# Patient Record
Sex: Male | Born: 1970 | Race: White | Hispanic: No | Marital: Single | Smoking: Never smoker
Health system: Southern US, Community
[De-identification: ages and names within clinical notes are randomized; demographics above are authoritative.]

## PROBLEM LIST (undated history)

## (undated) HISTORY — PX: WISDOM TOOTH EXTRACTION: SHX21

---

## 2003-08-03 ENCOUNTER — Emergency Department (HOSPITAL_COMMUNITY): Admission: EM | Admit: 2003-08-03 | Discharge: 2003-08-03 | Payer: Self-pay | Admitting: *Deleted

## 2013-12-30 ENCOUNTER — Encounter (HOSPITAL_COMMUNITY): Payer: Self-pay | Admitting: Emergency Medicine

## 2013-12-30 ENCOUNTER — Emergency Department (HOSPITAL_COMMUNITY): Payer: 59

## 2013-12-30 ENCOUNTER — Emergency Department (HOSPITAL_COMMUNITY)
Admission: EM | Admit: 2013-12-30 | Discharge: 2013-12-30 | Disposition: A | Discharge status: Home / Self Care | Payer: 59 | Attending: Emergency Medicine | Admitting: Emergency Medicine

## 2013-12-30 DIAGNOSIS — Y9364 Activity, baseball: Secondary | ICD-10-CM | POA: Insufficient documentation

## 2013-12-30 DIAGNOSIS — Y92838 Other recreation area as the place of occurrence of the external cause: Secondary | ICD-10-CM

## 2013-12-30 DIAGNOSIS — W219XXA Striking against or struck by unspecified sports equipment, initial encounter: Secondary | ICD-10-CM | POA: Insufficient documentation

## 2013-12-30 DIAGNOSIS — S0990XA Unspecified injury of head, initial encounter: Secondary | ICD-10-CM

## 2013-12-30 DIAGNOSIS — Z79899 Other long term (current) drug therapy: Secondary | ICD-10-CM | POA: Insufficient documentation

## 2013-12-30 DIAGNOSIS — Y9239 Other specified sports and athletic area as the place of occurrence of the external cause: Secondary | ICD-10-CM | POA: Insufficient documentation

## 2013-12-30 MED ORDER — IBUPROFEN 800 MG PO TABS
800.0000 mg | ORAL_TABLET | Freq: Once | ORAL | Status: AC
  Administered 2013-12-30: 800 mg via ORAL
  Filled 2013-12-30: qty 1

## 2013-12-30 MED ORDER — ONDANSETRON HCL 4 MG PO TABS: 4.0000 mg | ORAL_TABLET | Freq: Four times a day (QID) | ORAL | Status: DC

## 2013-12-30 NOTE — ED Notes (Signed)
Per EMS: pt was outside of the batting cages and was hit in the head with a baseball that was hit from the cages. Pt states that he does remember everything prior to being hit. Pt has an marking from the baseball behind his left ear. Bystanders states that he did pass out.

## 2013-12-30 NOTE — ED Provider Notes (Signed)
CSN: [id number]     Arrival date & time 12/30/13  1932 History   First MD Initiated Contact with Patient 12/30/13 1947     Chief Complaint  Patient presents with  . Head Injury     (Consider location/radiation/quality/duration/timing/severity/associated sxs/prior Treatment) Patient is a 43 y.o. male presenting with head injury. The history is provided by the patient and a relative.  Head Injury Location:  L parietal Mechanism of injury: direct blow   Pain details:    Quality:  Aching   Severity:  Severe   Timing:  Constant   Progression:  Unchanged Chronicity:  New Relieved by:  Nothing Worsened by:  Nothing tried Ineffective treatments:  None tried Associated symptoms: headache   Associated symptoms: no blurred vision, no difficulty breathing, no double vision, no focal weakness, no nausea, no neck pain, no numbness and no vomiting     43 yo male sp head injury. Baseball hit off bat. Struck patient to left head behind left ear. ?LOC. Repetitive questioning for couple minutes. Left ear ringing for about 10 minutes. No n/v. No neck pain. No other injuries. No acting himself per patient and family. Headache to area. Otherwise without complaint currently.  Not on blood thinners.  Previously feeling well.    History reviewed. No pertinent past medical history. Past Surgical History  Procedure Laterality Date  . Wisdom tooth extraction     History reviewed. No pertinent family history. History  Substance Use Topics  . Smoking status: Never Smoker   . Smokeless tobacco: Not on file  . Alcohol Use: No    Review of Systems  Constitutional: Negative for fever and chills.  HENT: Negative for congestion and rhinorrhea.   Eyes: Negative for blurred vision, double vision and visual disturbance.  Respiratory: Negative for cough and shortness of breath.   Cardiovascular: Negative for chest pain.  Gastrointestinal: Negative for nausea, vomiting and abdominal pain.  Genitourinary:  Negative for flank pain and difficulty urinating.  Musculoskeletal: Negative for back pain and neck pain.  Skin: Positive for color change (redness to location where patient got struck).  Neurological: Positive for headaches. Negative for dizziness, focal weakness, weakness and numbness.  All other systems reviewed and are negative.     Allergies  Review of patient's allergies indicates no known allergies.  Home Medications   Prior to Admission medications   Medication Sig Start Date End Date Taking? Authorizing Provider  ibuprofen (ADVIL,MOTRIN) 200 MG tablet Take 400 mg by mouth every 6 (six) hours as needed.   Yes Historical Provider, MD  Probiotic Product (PROBIOTIC DAILY PO) Take 1 tablet by mouth daily.   Yes Historical Provider, MD  ondansetron (ZOFRAN) 4 MG tablet Take 1 tablet (4 mg total) by mouth every 6 (six) hours. 12/30/13   Shanna CiscoMegan E Docherty, MD   BP 138/83  Pulse 97  Temp(Src) 98.2 F (36.8 C) (Oral)  Resp 16  SpO2 98% Physical Exam  Nursing note and vitals reviewed. Constitutional: He is oriented to person, place, and time. He appears well-developed and well-nourished. No distress.  HENT:  Head: Normocephalic and atraumatic. Head is without raccoon's eyes, without Battle's sign and without laceration.    Right Ear: Tympanic membrane normal.  Left Ear: Tympanic membrane normal.  Eyes: Conjunctivae and EOM are normal. Pupils are equal, round, and reactive to light. Right eye exhibits no discharge. Left eye exhibits no discharge.  Neck: Normal range of motion. Neck supple.  Cardiovascular: Normal heart sounds and intact distal pulses.  Pulmonary/Chest: Effort normal and breath sounds normal. No stridor. No respiratory distress. He has no wheezes. He has no rales.  Abdominal: Soft. He exhibits no distension. There is no tenderness. There is no guarding.  Musculoskeletal: He exhibits no edema and no tenderness.  Neurological: He is alert and oriented to person,  place, and time. He has normal strength. No cranial nerve deficit or sensory deficit. Coordination normal. GCS eye subscore is 4. GCS verbal subscore is 5. GCS motor subscore is 6.  Skin: Skin is warm and dry.  Psychiatric: He has a normal mood and affect. His behavior is normal.    ED Course  Procedures (including critical care time) Labs Review Labs Reviewed - No data to display  Imaging Review Ct Head Wo Contrast  12/30/2013   CLINICAL DATA:  Head injury.  Head hit with a baseball.  EXAM: CT HEAD WITHOUT CONTRAST  TECHNIQUE: Contiguous axial images were obtained from the base of the skull through the vertex without intravenous contrast.  COMPARISON:  None.  FINDINGS: Ventricles are normal in size and configuration. There are no parenchymal masses or mass effect. There are no areas of abnormal parenchymal attenuation. There are no extra-axial masses or abnormal fluid collections.  No intracranial hemorrhage.  No skull fracture. Visualized sinuses and mastoid air cells are clear.  IMPRESSION: Normal unenhanced CT scan of the brain.   Electronically Signed   By: Amie Portland M.D.   On: 12/30/2013 20:31     EKG Interpretation None      MDM   Final diagnoses:  Head injury    43 yo male sp head injury. Now GCS 15 and neuro intact. No n/v.  CT head negative.  No neck pain.  Patient discharged home. Return precautions given. To follow up with pcp. patient in agreement with plan.  Discussed head injury precautions.   Discussed case with Dr. Micheline Maze who is in agreement with assessment and plan.    Stevie Kern, MD 12/31/13 [phone]

## 2013-12-30 NOTE — ED Notes (Signed)
Pt returned from CT °

## 2013-12-30 NOTE — Discharge Instructions (Signed)
Head Injury, Adult °You have received a head injury. It does not appear serious at this time. Headaches and vomiting are common following head injury. It should be easy to awaken from sleeping. Sometimes it is necessary for you to stay in the emergency department for a while for observation. Sometimes admission to the hospital may be needed. After injuries such as yours, most problems occur within the first 24 hours, but side effects may occur up to 7 10 days after the injury. It is important for you to carefully monitor your condition and contact your health care provider or seek immediate medical care if there is a change in your condition. °WHAT ARE THE TYPES OF HEAD INJURIES? °Head injuries can be as minor as a bump. Some head injuries can be more severe. More severe head injuries include: °· A jarring injury to the brain (concussion). °· A bruise of the brain (contusion). This mean there is bleeding in the brain that can cause swelling. °· A cracked skull (skull fracture). °· Bleeding in the brain that collects, clots, and forms a bump (hematoma). °WHAT CAUSES A HEAD INJURY? °A serious head injury is most likely to happen to someone who is in a car wreck and is not wearing a seat belt. Other causes of major head injuries include bicycle or motorcycle accidents, sports injuries, and falls. °HOW ARE HEAD INJURIES DIAGNOSED? °A complete history of the event leading to the injury and your current symptoms will be helpful in diagnosing head injuries. Many times, pictures of the brain, such as CT or MRI are needed to see the extent of the injury. Often, an overnight hospital stay is necessary for observation.  °WHEN SHOULD I SEEK IMMEDIATE MEDICAL CARE?  °You should get help right away if: °· You have confusion or drowsiness. °· You feel sick to your stomach (nauseous) or have continued, forceful vomiting. °· You have dizziness or unsteadiness that is getting worse. °· You have severe, continued headaches not  relieved by medicine. Only take over-the-counter or prescription medicines for pain, fever, or discomfort as directed by your health care provider. °· You do not have normal function of the arms or legs or are unable to walk. °· You notice changes in the black spots in the center of the colored part of your eye (pupil). °· You have a clear or bloody fluid coming from your nose or ears. °· You have a loss of vision. °During the next 24 hours after the injury, you must stay with someone who can watch you for the warning signs. This person should contact local emergency services (911 in the U.S.) if you have seizures, you become unconscious, or you are unable to wake up. °HOW CAN I PREVENT A HEAD INJURY IN THE FUTURE? °The most important factor for preventing major head injuries is avoiding motor vehicle accidents.  To minimize the potential for damage to your head, it is crucial to wear seat belts while riding in motor vehicles. Wearing helmets while bike riding and playing collision sports (like football) is also helpful. Also, avoiding dangerous activities around the house will further help reduce your risk of head injury.  °WHEN CAN I RETURN TO NORMAL ACTIVITIES AND ATHLETICS? °You should be reevaluated by your health care provider before returning to these activities. If you have any of the following symptoms, you should not return to activities or contact sports until 1 week after the symptoms have stopped: °· Persistent headache. °· Dizziness or vertigo. °· Poor attention and concentration. °·   Confusion.  Memory problems.  Nausea or vomiting.  Fatigue or tire easily.  Irritability.  Intolerant of bright lights or loud noises.  Anxiety or depression.  Disturbed sleep. MAKE SURE YOU:   Understand these instructions.  Will watch your condition.  Will get help right away if you are not doing well or get worse. Document Released: 07/15/2005 Document Revised: 05/05/2013 Document Reviewed:  03/22/2013 [facility] Patient Information 2014 [place], [place].   Emergency Department Resource Guide 1) Find a Doctor and Pay Out of Pocket Although you won't have to find out who is covered by your insurance plan, it is a good idea to ask around and get recommendations. You will then need to call the office and see if the doctor you have chosen will accept you as a new patient and what types of options they offer for patients who are self-pay. Some doctors offer discounts or will set up payment plans for their patients who do not have insurance, but you will need to ask so you aren't surprised when you get to your appointment.  2) Contact Your Local Health Department Not all health departments have doctors that can see patients for sick visits, but many do, so it is worth a call to see if yours does. If you don't know where your local health department is, you can check in your phone book. The CDC also has a tool to help you locate your state's health department, and many state websites also have listings of all of their local health departments.  3) Find a Walk-in Clinic If your illness is not likely to be very severe or complicated, you may want to try a walk in clinic. These are popping up all over the country in pharmacies, drugstores, and shopping centers. They're usually staffed by nurse practitioners or physician assistants that have been trained to treat common illnesses and complaints. They're usually fairly quick and inexpensive. However, if you have serious medical issues or chronic medical problems, these are probably not your best option.  No Primary Care Doctor: - Call Health Connect at  [phone] - they can help you locate a primary care doctor that  accepts your insurance, provides certain services, etc. - Physician Referral Service- [phone]  Chronic Pain Problems: Organization         Address  Phone   Notes  [facility]  [phone] Patients need to  be referred by their primary care doctor.   Medication Assistance: Organization         Address  Phone   Notes  [facility] Medication [facility] [address] [place], [place] [address] [phone] --Must be a resident of [facility] -- Must have NO insurance coverage whatsoever (no Medicaid/ Medicare, etc.) -- The pt. MUST have a primary care doctor that directs their care regularly and follows them in the community   [facility]  [phone]   [employer]  [phone]    Agencies that provide inexpensive medical care: Organization         Address  Phone   Notes  [facility]  [phone]   [facility]    [phone]   [facility] [address] [place], [place] [address] [phone]   [facility] [address] [place], [place] [phone]   Planned Parenthood    [phone]   [facility]    [phone]  [facility]  [address], [place] Phone:  [phone], Fax:  [phone] Hours of Operation:  9 am - 6 pm, M-F.  Also accepts Medicaid/Medicare and self-pay.  [facility]  [address], [place] Phone: [phone], Fax: [phone]. Hours of Operation:  8:30 am - 5:30 pm, M-F.  Also accepts Medicaid and self-pay.  [facility] [place] [address] [place] Phone: [phone]   [facility] [address] Natasha Bence [place], [place] [phone], Ext. [phone] Mondays & Thursdays: 7-9 AM.  First 15 patients are seen on a first come, first serve basis.    Medicaid-accepting [facility] Providers:  Organization         Address  Phone   Notes  [facility] [address], [place] [phone] Also accepts self-pay patients.  [facility] [address] Laurell Josephs [place], [place]  [phone]   [facility] [address], [place] [phone]   [facility] [address], [place] [phone]   Renaye Rakers [address], [place]   [phone] Only accepts [place] patients after they have their name applied to their card.   Self-Pay (no insurance) in [facility]:  Organization         Address  Phone   Notes  Sickle Cell Patients, [facility] [address] [place], [place] [phone]   [facility] [address] [place], [place] [phone]   [facility] [place]  [address], [place] [phone]   [facility]/Dr. Osei-Bonsu  [address], [place] or [address], [place] [phone] Phone number for both [place] and [place] locations is the same.  [facility] [address] [place] [phone]   [facility] [address] [place] or [address] [phone]   [facility] [address], [place] [phone], phone; [phone], fax Sees patients 1st and 3rd Saturday of every month.  Must not qualify for public or private insurance (i.e. Medicaid, Medicare, [place] Health Choice, Veterans' Benefits)  Household income should be no more than 200% of the poverty level The clinic cannot treat you if you are pregnant or think you are pregnant  Sexually transmitted diseases are not treated at the clinic.    Dental Care: Organization         Address  Phone  Notes  [facility] Department of [facility] [address] [place], [place] [phone] Accepts children up to age 71 who are enrolled in [place] or [place] Health Choice; pregnant women with a Medicaid card; and children who have applied for Medicaid or [place] Health Choice, but were declined, whose parents can  pay a reduced fee at time of service.  [facility]  [address], [place] [phone] Accepts children up to age 80 who are enrolled in [place] or [place] Health Choice; pregnant women with a Medicaid card; and children who have applied for Medicaid or [place] Health Choice, but were declined, whose parents can pay a reduced fee at time of service.  [facility]  [phone]  [address] [place], [place] [phone] Patients are seen by appointment only. Walk-ins are not accepted. [facility] will see patients 2 years of age and older. Monday - Tuesday (8am-5pm) Most Wednesdays (8:30-5pm) $30 per visit, cash only  [facility] Adult Dental Access PROGRAM  [address], [place] [facility] [phone] Patients are seen by appointment only. Walk-ins are not accepted. [facility] will see patients 77 years of age and older. One Wednesday Evening (Monthly: Volunteer Based).  $30 per visit, cash only  [employer]  [phone] for adults; Children under age 18, call Graduate Pediatric Dentistry at [phone]. Children aged 70-14, please call [phone] to request a pediatric application.  Dental services are provided in all areas of dental care including fillings, crowns and bridges, complete and partial dentures, implants, gum treatment, root canals, and extractions. Preventive care is also provided. Treatment is provided to both adults and children. Patients are selected via a lottery and there is often a waiting list.   [facility] [address] [place]  [phone] [email or web address]   [facility] [address] [place], [place] [phone], Ext. [phone] Second and Fourth Thursday of each month, opens at 6:30 AM; Clinic ends at 9 AM.  Patients are seen on a first-come first-served basis, and a limited number are seen during each clinic.   [facility]  [address] Ether Griffins [place], [place] [phone]   Eligibility Requirements You must have lived in [place], [place], or [place] counties for at least the last three months.   You cannot be eligible for state or federal sponsored [place], including [employer], [place], or [employer].   You generally cannot be eligible for healthcare insurance through your employer.    How to apply: Eligibility screenings are held every Tuesday and Wednesday afternoon from 1:00 pm until 4:00 pm. You do not need an appointment for the interview!  [facility] [address], [place], [place] [phone]   [facility] Health Department  [phone]   [facility] Department  [phone]   [facility]  [phone]    Behavioral Health Resources in the Community: Intensive Outpatient Programs Organization         Address  Phone  Notes  [facility] [address], [place], [place] [phone]   [facility] Outpatient [address] [place], [place] [phone]   [facility] [address], [place], [place]  [phone]   [facility] Mental Health [address]  [place], [place] [phone] or [phone]   Substance Abuse Resources Organization         Address  Phone  Notes  [facility] Drug Services  [phone]   [facility]  [phone]   [facility]  [phone]   Floydene Flock  [phone]   Residential & Outpatient Substance Abuse Program  [phone]   Psychological Services Organization         Address  Phone  Notes  [facility] Behavioral Health  [phone]   [facility]  [phone]   [facility] Mental Health [address] [place] [phone] or [phone]    Mobile Crisis Teams Organization         Address  Phone  Notes  [facility], Mobile Crisis Care Unit  [phone]    [facility]  [address] [place], [place] [phone]  [facility] [address] [place] [phone]    Self-Help/Support Groups Organization         Address  Phone             Notes  [facility] [place] - variety of support groups  336- [phone] Call for more information  Narcotics Anonymous (NA), [facility] [address], [place]  2 meetings at this location   Statisticianesidential Treatment Programs Organization         Address  Phone  Notes  [facility] [address],    [place]  [phone]   [facility]  [address], [place] [phone], [place], [place] [phone]   [facility] [address], [place] [phone] Admissions: 8am-3pm M-F  [facility] [address]    [place], [place] [phone]   [facility] [address], [place], [place] [phone]   [facility] [address]  [place], [place] [phone]   [facility] - Intensive Outpatient [address], [place], [place] [phone]   [facility] ([facility] [address]  [place], [place] [phone] or [phone]   [facility] ([facility]) [address] [place], [place] [phone] Accepts Medicaid  [facility] [address]  [place] [phone] Substance Abuse/Addiction Treatment   [facility] Behavioral Health Resources Organization         Address  Phone  Notes  [facility]  [phone]   Angie FavaJulie Brannon, PhD [address] [place], [place]   [phone] or [phone]   [facility]   [address] [place], [place] [phone](336) [phone]   [facility] [address], [place], [place] [phone](336) [phone] Insurance/Medicaid/sponsorship through [facility]  [facility] [address]                                     [place], [place] [phone](336) [phone] Therapy/tele-psych/case  [facility] [address]   [place], [place] [phone]    Dr. Lolly MustacheArfeen  [phone](336) [phone]   [facility]  [employer] [facility] 1) [address], [place] 2) [address], [place] 3)  [address], [place] [phone](336) [phone]  [phone]) [phone]   [facility] [place] Child Abuse Hotline [phone]) [phone] or [phone](336) [phone] (After Hours)

## 2013-12-31 NOTE — ED Provider Notes (Signed)
Medical screening examination/treatment/procedure(s) were conducted as a shared visit with resident-physician practitioner(s) and myself.  I personally evaluated the patient during the encounter.  Pt is a 43 y.o. male with pmhx as above presenting with head injury from stray baseball.  Pt found to have no acute CT head findings. On exam, VSS, pt in NAD> No focal neuro findings. WIll d/c hoem w/ head injury return precautions. Shanna Cisco, MD 12/31/13 [phone]

## 2016-06-13 DIAGNOSIS — Z0000 Encounter for general adult medical examination without abnormal findings: Secondary | ICD-10-CM | POA: Diagnosis not present

## 2017-06-25 DIAGNOSIS — Z0000 Encounter for general adult medical examination without abnormal findings: Secondary | ICD-10-CM | POA: Diagnosis not present

## 2018-05-29 DIAGNOSIS — R590 Localized enlarged lymph nodes: Secondary | ICD-10-CM | POA: Diagnosis not present

## 2018-08-14 DIAGNOSIS — Z0000 Encounter for general adult medical examination without abnormal findings: Secondary | ICD-10-CM | POA: Diagnosis not present

## 2018-08-14 DIAGNOSIS — E782 Mixed hyperlipidemia: Secondary | ICD-10-CM | POA: Diagnosis not present

## 2018-08-14 DIAGNOSIS — Z1211 Encounter for screening for malignant neoplasm of colon: Secondary | ICD-10-CM | POA: Diagnosis not present

## 2018-08-17 DIAGNOSIS — Z1211 Encounter for screening for malignant neoplasm of colon: Secondary | ICD-10-CM | POA: Diagnosis not present

## 2018-08-17 DIAGNOSIS — E782 Mixed hyperlipidemia: Secondary | ICD-10-CM | POA: Diagnosis not present

## 2019-08-24 ENCOUNTER — Ambulatory Visit: Payer: 59 | Attending: Internal Medicine

## 2019-08-24 DIAGNOSIS — Z20822 Contact with and (suspected) exposure to covid-19: Secondary | ICD-10-CM | POA: Insufficient documentation

## 2019-08-25 LAB — NOVEL CORONAVIRUS, NAA: SARS-CoV-2, NAA: NOT DETECTED

## 2020-01-03 ENCOUNTER — Ambulatory Visit (INDEPENDENT_AMBULATORY_CARE_PROVIDER_SITE_OTHER): Payer: 59 | Admitting: Dermatology

## 2020-01-03 ENCOUNTER — Encounter: Payer: Self-pay | Admitting: Dermatology

## 2020-01-03 ENCOUNTER — Other Ambulatory Visit: Payer: Self-pay

## 2020-01-03 DIAGNOSIS — B353 Tinea pedis: Secondary | ICD-10-CM | POA: Diagnosis not present

## 2020-01-03 NOTE — Progress Notes (Signed)
   Follow-Up Visit   Subjective  Jacob Shelton is a 49 y.o. male who presents for the following: Skin Problem (left big toe fungus x years. treatment none).  Toenail fungus Location: Left foot Duration: Years Quality:  Associated Signs/Symptoms: Modifying Factors:  Severity:  Timing: Context:   The following portions of the chart were reviewed this encounter and updated as appropriate:     Objective  Well appearing patient in no apparent distress; mood and affect are within normal limits.  A focused examination was performed including Hands and feet and arms and legs and nails.. Relevant physical exam findings are noted in the Assessment and Plan.   Assessment & Plan  First follow-up in many years for Jacob Shelton.  His old tinea versicolor has seemingly stayed cleared, but now he is prone to get fungus mainly on his left foot.  There is macerated scale on the 2 outer toe webs and hyper curvature with thickening and yellowish discoloration on the distal medial left great toenail.  The choices I discussed with Jacob Shelton included doing nothing at all as long as there is no pain, limited surgery of the plate and growth center where there is hyper curvature, topical therapy like Jublia (no known side effect, no monitoring, but the majority of people will NOT be clear after daily application for a year and insurance frequently will not cover this), or 3 to 4 months of oral generic Lamisil (terbinafine).  I prefer if someone wants oral therapy to try and confirm the diagnosis with scrapings and culture first.  Blood tests including CBC and liver tests are done before the oral therapy and 6 to 8 weeks later.  The therapy takes 3 to 4 months and roughly 2/3 of people will have a clear toenail at 1 year, but several years later recurrence is common.  The remote risk of therapy would be damage to the liver or a life-threatening rash called TEN.  He not will think this over and give me a call in the  next week or 2 if further discussion is warranted.

## 2020-01-10 ENCOUNTER — Encounter: Payer: Self-pay | Admitting: Dermatology

## 2021-01-09 ENCOUNTER — Emergency Department (HOSPITAL_BASED_OUTPATIENT_CLINIC_OR_DEPARTMENT_OTHER)
Admission: EM | Admit: 2021-01-09 | Discharge: 2021-01-09 | Disposition: A | Discharge status: Home / Self Care | Payer: 59 | Source: Home / Self Care | Attending: Emergency Medicine | Admitting: Emergency Medicine

## 2021-01-09 ENCOUNTER — Encounter (HOSPITAL_BASED_OUTPATIENT_CLINIC_OR_DEPARTMENT_OTHER): Payer: Self-pay | Admitting: Urology

## 2021-01-09 ENCOUNTER — Emergency Department (HOSPITAL_BASED_OUTPATIENT_CLINIC_OR_DEPARTMENT_OTHER)
Admission: EM | Admit: 2021-01-09 | Discharge: 2021-01-09 | Disposition: A | Discharge status: Home / Self Care | Payer: 59 | Attending: Emergency Medicine | Admitting: Emergency Medicine

## 2021-01-09 ENCOUNTER — Other Ambulatory Visit (HOSPITAL_BASED_OUTPATIENT_CLINIC_OR_DEPARTMENT_OTHER): Payer: Self-pay

## 2021-01-09 ENCOUNTER — Encounter (HOSPITAL_BASED_OUTPATIENT_CLINIC_OR_DEPARTMENT_OTHER): Payer: Self-pay

## 2021-01-09 ENCOUNTER — Other Ambulatory Visit: Payer: Self-pay

## 2021-01-09 ENCOUNTER — Emergency Department (HOSPITAL_BASED_OUTPATIENT_CLINIC_OR_DEPARTMENT_OTHER): Payer: 59

## 2021-01-09 ENCOUNTER — Ambulatory Visit: Payer: Self-pay

## 2021-01-09 DIAGNOSIS — N200 Calculus of kidney: Secondary | ICD-10-CM

## 2021-01-09 DIAGNOSIS — R112 Nausea with vomiting, unspecified: Secondary | ICD-10-CM | POA: Diagnosis not present

## 2021-01-09 DIAGNOSIS — N132 Hydronephrosis with renal and ureteral calculous obstruction: Secondary | ICD-10-CM | POA: Insufficient documentation

## 2021-01-09 DIAGNOSIS — R109 Unspecified abdominal pain: Secondary | ICD-10-CM | POA: Diagnosis present

## 2021-01-09 LAB — COMPREHENSIVE METABOLIC PANEL
ALT: 45 U/L — ABNORMAL HIGH (ref 0–44)
AST: 31 U/L (ref 15–41)
Albumin: 4.8 g/dL (ref 3.5–5.0)
Alkaline Phosphatase: 46 U/L (ref 38–126)
Anion gap: 10 (ref 5–15)
BUN: 18 mg/dL (ref 6–20)
CO2: 23 mmol/L (ref 22–32)
Calcium: 9.4 mg/dL (ref 8.9–10.3)
Chloride: 103 mmol/L (ref 98–111)
Creatinine, Ser: 1.06 mg/dL (ref 0.61–1.24)
GFR, Estimated: >60 mL/min (ref >60)
Glucose, Bld: 115 mg/dL — ABNORMAL HIGH (ref 70–99)
Potassium: 4 mmol/L (ref 3.5–5.1)
Sodium: 136 mmol/L (ref 135–145)
Total Bilirubin: 0.6 mg/dL (ref 0.3–1.2)
Total Protein: 8.5 g/dL — ABNORMAL HIGH (ref 6.5–8.1)

## 2021-01-09 LAB — URINALYSIS, MICROSCOPIC (REFLEX)

## 2021-01-09 LAB — CBC WITH DIFFERENTIAL/PLATELET
Abs Immature Granulocytes: 0.02 10*3/uL (ref 0.00–0.07)
Basophils Absolute: 0 10*3/uL (ref 0.0–0.1)
Basophils Relative: 0 %
Eosinophils Absolute: 0 10*3/uL (ref 0.0–0.5)
Eosinophils Relative: 0 %
HCT: 46.8 % (ref 39.0–52.0)
Hemoglobin: 16.4 g/dL (ref 13.0–17.0)
Immature Granulocytes: 0 %
Lymphocytes Relative: 13 %
Lymphs Abs: 1.4 10*3/uL (ref 0.7–4.0)
MCH: 30.8 pg (ref 26.0–34.0)
MCHC: 35 g/dL (ref 30.0–36.0)
MCV: 87.8 fL (ref 80.0–100.0)
Monocytes Absolute: 0.7 10*3/uL (ref 0.1–1.0)
Monocytes Relative: 6 %
Neutro Abs: 9 10*3/uL — ABNORMAL HIGH (ref 1.7–7.7)
Neutrophils Relative %: 81 %
Platelets: 233 10*3/uL (ref 150–400)
RBC: 5.33 MIL/uL (ref 4.22–5.81)
RDW: 12.1 % (ref 11.5–15.5)
WBC: 11.1 10*3/uL — ABNORMAL HIGH (ref 4.0–10.5)
nRBC: 0 % (ref 0.0–0.2)

## 2021-01-09 LAB — URINALYSIS, ROUTINE W REFLEX MICROSCOPIC
Bilirubin Urine: NEGATIVE
Glucose, UA: NEGATIVE mg/dL
Ketones, ur: NEGATIVE mg/dL
Leukocytes,Ua: NEGATIVE
Nitrite: NEGATIVE
Protein, ur: 30 mg/dL — AB
Specific Gravity, Urine: >1.03 — ABNORMAL HIGH (ref 1.005–1.030)
pH: 5.5 (ref 5.0–8.0)

## 2021-01-09 LAB — LIPASE, BLOOD: Lipase: 30 U/L (ref 11–51)

## 2021-01-09 MED ORDER — ONDANSETRON HCL 4 MG/2ML IJ SOLN
4.0000 mg | Freq: Once | INTRAMUSCULAR | Status: AC
  Administered 2021-01-09: 4 mg via INTRAVENOUS
  Filled 2021-01-09: qty 2

## 2021-01-09 MED ORDER — HYDROCODONE-ACETAMINOPHEN 5-325 MG PO TABS: 1.0000 | ORAL_TABLET | Freq: Four times a day (QID) | ORAL | 0 refills | Status: AC | PRN

## 2021-01-09 MED ORDER — ONDANSETRON HCL 4 MG PO TABS
4.0000 mg | ORAL_TABLET | Freq: Four times a day (QID) | ORAL | 0 refills | Status: AC
  Filled 2021-01-09: qty 15, 4d supply, fill #0

## 2021-01-09 MED ORDER — KETOROLAC TROMETHAMINE 60 MG/2ML IM SOLN
60.0000 mg | Freq: Once | INTRAMUSCULAR | Status: AC
  Administered 2021-01-09: 60 mg via INTRAMUSCULAR
  Filled 2021-01-09: qty 2

## 2021-01-09 MED ORDER — TAMSULOSIN HCL 0.4 MG PO CAPS
0.4000 mg | ORAL_CAPSULE | Freq: Every day | ORAL | 0 refills | Status: AC
  Filled 2021-01-09: qty 7, 7d supply, fill #0

## 2021-01-09 NOTE — ED Triage Notes (Signed)
Continued Left Flank pain, nausea/vomiting since discharge earlier. Still denies any urinary symptoms

## 2021-01-09 NOTE — ED Notes (Signed)
Pt discharged to home. Discharge instructions have been discussed with patient and/or family members. Pt verbally acknowledges understanding d/c instructions, and endorses comprehension to checkout at registration before leaving.  °

## 2021-01-09 NOTE — ED Notes (Signed)
Pt was taken to xray prior to triage.

## 2021-01-09 NOTE — ED Notes (Signed)
ED Provider at bedside. 

## 2021-01-09 NOTE — ED Provider Notes (Signed)
[facility] EMERGENCY DEPARTMENT Provider Note   CSN: [id number] Arrival date & time: 01/09/21  1004     History Chief Complaint  Patient presents with  . Flank Pain    Jacob Shelton is a 50 y.o. male.  The history is provided by the patient.  Flank Pain This is a new problem. The current episode started 6 to 12 hours ago. The problem has been resolved. Pertinent negatives include no chest pain, no abdominal pain, no headaches and no shortness of breath. Exacerbated by: otc meds. Nothing relieves the symptoms. Treatments tried: otc meds. The treatment provided no relief.      History reviewed. No pertinent past medical history.  There are no problems to display for this patient.   Past Surgical History:  Procedure Laterality Date  . WISDOM TOOTH EXTRACTION         History reviewed. No pertinent family history.  Social History   Tobacco Use  . Smoking status: Never  . Smokeless tobacco: Never  Substance Use Topics  . Alcohol use: No  . Drug use: No    Home Medications Prior to Admission medications   Medication Sig Start Date End Date Taking? Authorizing Provider  ondansetron (ZOFRAN) 4 MG tablet Take 1 tablet (4 mg total) by mouth every 6 (six) hours for 15 doses. 01/09/21 01/13/21 Yes Yareliz Thorstenson, DO  tamsulosin (FLOMAX) 0.4 MG CAPS capsule Take 1 capsule (0.4 mg total) by mouth daily for 7 days. 01/09/21 01/16/21 Yes Oksana Deberry, DO  ibuprofen (ADVIL,MOTRIN) 200 MG tablet Take 400 mg by mouth every 6 (six) hours as needed.    [provider]  Probiotic Product (PROBIOTIC DAILY PO) Take 1 tablet by mouth daily.    [provider]    Allergies    Patient has no known allergies.  Review of Systems   Review of Systems  Constitutional:  Negative for chills and fever.  HENT:  Negative for ear pain and sore throat.   Eyes:  Negative for pain and visual disturbance.  Respiratory:  Negative for cough and shortness of breath.    Cardiovascular:  Negative for chest pain and palpitations.  Gastrointestinal:  Positive for nausea and vomiting. Negative for abdominal pain, constipation and diarrhea.  Genitourinary:  Positive for flank pain. Negative for difficulty urinating, dysuria, hematuria, penile discharge, penile pain, penile swelling, scrotal swelling, testicular pain and urgency.  Musculoskeletal:  Negative for arthralgias and back pain.  Skin:  Negative for color change and rash.  Neurological:  Negative for seizures, syncope and headaches.  All other systems reviewed and are negative.  Physical Exam Updated Vital Signs BP (!) 167/99 (BP Location: Right Arm)   Pulse 95   Temp 98.2 F (36.8 C) (Oral)   Resp 18   Ht 5\' 9"  (1.753 m)   Wt 96.6 kg   SpO2 99%   BMI 31.45 kg/m   Physical Exam Vitals and nursing note reviewed.  Constitutional:      General: He is not in acute distress.    Appearance: He is well-developed. He is not ill-appearing.  HENT:     Head: Normocephalic and atraumatic.     Nose: Nose normal.     Mouth/Throat:     Mouth: Mucous membranes are moist.  Eyes:     Extraocular Movements: Extraocular movements intact.     Conjunctiva/sclera: Conjunctivae normal.     Pupils: Pupils are equal, round, and reactive to light.  Cardiovascular:     Rate and Rhythm:  Normal rate and regular rhythm.     Pulses: Normal pulses.     Heart sounds: Normal heart sounds. No murmur heard. Pulmonary:     Effort: Pulmonary effort is normal. No respiratory distress.     Breath sounds: Normal breath sounds.  Abdominal:     General: Abdomen is flat.     Palpations: Abdomen is soft.     Tenderness: There is no abdominal tenderness. There is no right CVA tenderness, left CVA tenderness, guarding or rebound.     Hernia: No hernia is present.  Musculoskeletal:        General: No tenderness. Normal range of motion.     Cervical back: Normal range of motion and neck supple.  Skin:    General: Skin is warm  and dry.     Capillary Refill: Capillary refill takes less than 2 seconds.  Neurological:     General: No focal deficit present.     Mental Status: He is alert.    ED Results / Procedures / Treatments   Labs (all labs ordered are listed, but only abnormal results are displayed) Labs Reviewed - No data to display  EKG None  Radiology No results found.  Procedures Procedures   Medications Ordered in ED Medications - No data to display  ED Course  I have reviewed the triage vital signs and the nursing notes.  Pertinent labs & imaging results that were available during my care of the patient were reviewed by me and considered in my medical decision making (see chart for details).    MDM Rules/Calculators/A&P                          Jacob Shelton is here with left flank pain.  On and off for the last several hours.  Feeling better after over-the-counter pain medications.  No current pain at this time.  No hematuria.  Family history of kidney stones.  Has had multiple bowel movements but no diarrhea this morning.  Had 1 episode of emesis.  Overall suspect viral gastroenteritis versus a small kidney stone.  He is in no discomfort at this time.  Abdominal exam is benign.  Will recommend that he continue ibuprofen, Tylenol as needed for pain.  We will take Zofran as needed for nausea.  Will prescribe Flomax as suspect that this is likely a kidney stone.  Understands return precautions including uncontrollable nausea, vomiting, pain, fever.  Vital signs normal.  Asymptomatic and overall unremarkable exam at this time.  Discharged to home and understands return precautions.  This chart was dictated using voice recognition software.  Despite best efforts to proofread,  errors can occur which can change the documentation meaning.   Final Clinical Impression(s) / ED Diagnoses Final diagnoses:  Left flank pain    Rx / DC Orders ED Discharge Orders          Ordered    ondansetron  (ZOFRAN) 4 MG tablet  Every 6 hours        01/09/21 1030    tamsulosin (FLOMAX) 0.4 MG CAPS capsule  Daily        01/09/21 1030             Ansel Ferrall, DO 01/09/21 1034

## 2021-01-09 NOTE — Telephone Encounter (Signed)
Pt c/o continued left flank pain. Pt stated the pain is constant but varies in intensity. During call pain went from 10/10 down to 4/10. Pt stated the pain feels like someone punched him at the affected area. Pain began at 0600.  Pt vomited twice today. Voided 1 hour ago of undigested food. Pt denied any coffee ground emesis.. Denies pain or burning with urination, blood abdominal pain, leg weakness or fever.  Return precautions given from ED visit this am was to return to ED for pain.  Care advice given to pt and pt verbalized understanding.

## 2021-01-09 NOTE — Discharge Instructions (Addendum)
Overall suspect that you likely have a small kidney stone or developing viral gastroenteritis.  Recommend that you take 800 mg ibuprofen every 8 hours as needed for pain.  Recommend that you take 1000 mg of Tylenol every 6 hours as needed for pain.  You can take both the Tylenol and ibuprofen at the same time.  Take Flomax as prescribed to help promote passage of kidney stone if that is what is going on.  Recommend taking Zofran as needed for nausea and vomiting.  If you develop any uncontrollable nausea, vomiting, pain, fever greater than 100.4 please return for evaluation as discussed.

## 2021-01-09 NOTE — ED Triage Notes (Signed)
Pt woke up with left flank pain starting at 0600. Had 1 episode of emesis. Had 4 episodes BM this morning. States pain has improved. Took 1 tylenol 20 mins pta. Denies urinary symptoms.

## 2021-01-09 NOTE — ED Provider Notes (Signed)
[facility] EMERGENCY DEPARTMENT Provider Note   CSN: [id number] Arrival date & time: 01/09/21  1455     History Chief Complaint  Patient presents with   Flank Pain    Jacob Shelton is a 50 y.o. male.  HPI Patient is a 50 year old male who presents to the emergency department due to left flank pain.  Seen earlier today in this emergency department and discharged in stable condition.  Patient states that after leaving his pain began worsening once again.  Reports associated intermittent nausea and vomiting.  No current nausea at this time.  Denies any urinary complaints.  States his current pain is 10/10.  Worsens with movement.  No chest pain, shortness of breath, abdominal pain, URI symptoms.     History reviewed. No pertinent past medical history.  There are no problems to display for this patient.   Past Surgical History:  Procedure Laterality Date   WISDOM TOOTH EXTRACTION         History reviewed. No pertinent family history.  Social History   Tobacco Use   Smoking status: Never   Smokeless tobacco: Never  Substance Use Topics   Alcohol use: No   Drug use: No    Home Medications Prior to Admission medications   Medication Sig Start Date End Date Taking? Authorizing Provider  HYDROcodone-acetaminophen (NORCO/VICODIN) 5-325 MG tablet Take 1 tablet by mouth every 6 (six) hours as needed. 01/09/21  Yes Placido Sou, PA-C  ibuprofen (ADVIL,MOTRIN) 200 MG tablet Take 400 mg by mouth every 6 (six) hours as needed.    [provider]  ondansetron (ZOFRAN) 4 MG tablet Take 1 tablet (4 mg total) by mouth every 6 (six) hours 01/09/21 01/13/21  Curatolo, Adam, DO  Probiotic Product (PROBIOTIC DAILY PO) Take 1 tablet by mouth daily.    [provider]  tamsulosin (FLOMAX) 0.4 MG CAPS capsule Take 1 capsule (0.4 mg total) by mouth daily for 7 days. 01/09/21 01/16/21  Virgina Norfolk, DO    Allergies    Patient has no known allergies.  Review of  Systems   Review of Systems  All other systems reviewed and are negative. Ten systems reviewed and are negative for acute change, except as noted in the HPI.   Physical Exam Updated Vital Signs BP (!) 147/92 (BP Location: Right Arm)   Pulse (!) 102   Temp 98.6 F (37 C) (Oral)   Resp 18   Ht 5\' 9"  (1.753 m)   Wt 96.6 kg   SpO2 97%   BMI 31.45 kg/m   Physical Exam Vitals and nursing note reviewed.  Constitutional:      General: He is not in acute distress.    Appearance: Normal appearance. He is not ill-appearing, toxic-appearing or diaphoretic.  HENT:     Head: Normocephalic and atraumatic.     Right Ear: External ear normal.     Left Ear: External ear normal.     Nose: Nose normal.     Mouth/Throat:     Mouth: Mucous membranes are moist.     Pharynx: Oropharynx is clear. No oropharyngeal exudate or posterior oropharyngeal erythema.  Eyes:     General: No scleral icterus.       Right eye: No discharge.        Left eye: No discharge.     Extraocular Movements: Extraocular movements intact.     Conjunctiva/sclera: Conjunctivae normal.  Cardiovascular:     Rate and Rhythm: Normal rate and regular rhythm.  Pulses: Normal pulses.     Heart sounds: Normal heart sounds. No murmur heard.   No friction rub. No gallop.  Pulmonary:     Effort: Pulmonary effort is normal. No respiratory distress.     Breath sounds: Normal breath sounds. No stridor. No wheezing, rhonchi or rales.  Abdominal:     General: Abdomen is flat.     Palpations: Abdomen is soft.     Tenderness: There is no abdominal tenderness. There is left CVA tenderness. There is no right CVA tenderness.     Comments: Protuberant abdomen that is soft and nontender.  Moderate left flank pain.  No right flank pain.  No midline spine pain.  Musculoskeletal:        General: Normal range of motion.     Cervical back: Normal range of motion and neck supple. No tenderness.  Skin:    General: Skin is warm and dry.   Neurological:     General: No focal deficit present.     Mental Status: He is alert and oriented to person, place, and time.  Psychiatric:        Mood and Affect: Mood normal.        Behavior: Behavior normal.    ED Results / Procedures / Treatments   Labs (all labs ordered are listed, but only abnormal results are displayed) Labs Reviewed  CBC WITH DIFFERENTIAL/PLATELET - Abnormal; Notable for the following components:      Result Value   WBC 11.1 (*)    Neutro Abs 9.0 (*)    All other components within normal limits  COMPREHENSIVE METABOLIC PANEL - Abnormal; Notable for the following components:   Glucose, Bld 115 (*)    Total Protein 8.5 (*)    ALT 45 (*)    All other components within normal limits  URINALYSIS, ROUTINE W REFLEX MICROSCOPIC - Abnormal; Notable for the following components:   APPearance HAZY (*)    Specific Gravity, Urine >1.030 (*)    Hgb urine dipstick LARGE (*)    Protein, ur 30 (*)    All other components within normal limits  URINALYSIS, MICROSCOPIC (REFLEX) - Abnormal; Notable for the following components:   Bacteria, UA MANY (*)    All other components within normal limits  URINE CULTURE  LIPASE, BLOOD   EKG None  Radiology CT Renal Stone Study  Result Date: 01/09/2021 CLINICAL DATA:  Left flank pain. EXAM: CT ABDOMEN AND PELVIS WITHOUT CONTRAST TECHNIQUE: Multidetector CT imaging of the abdomen and pelvis was performed following the standard protocol without IV contrast. COMPARISON:  None. FINDINGS: Lower chest: No acute abnormality. Hepatobiliary: Diffusely decreased liver density with areas of sparing along the gallbladder fossa. No focal liver abnormality. The gallbladder is unremarkable. No biliary dilatation. Pancreas: Unremarkable. No pancreatic ductal dilatation or surrounding inflammatory changes. Spleen: Normal in size without focal abnormality. Adrenals/Urinary Tract: The adrenal glands are unremarkable. Bilateral punctate renal calculi.  4 mm calculus in the proximal left ureter with resultant mild hydronephrosis. The bladder is unremarkable for the degree of distension. Stomach/Bowel: Small hiatal hernia. The stomach is otherwise within normal limits. No bowel wall thickening, distention, or surrounding inflammatory changes. Moderate sigmoid colonic diverticulosis. Normal appendix. Vascular/Lymphatic: No significant vascular findings are present. No enlarged abdominal or pelvic lymph nodes. Reproductive: Prostate is unremarkable. Other: Small fat containing left inguinal hernia. No free fluid or pneumoperitoneum. Musculoskeletal: No acute or significant osseous findings. IMPRESSION: 1. 4 mm calculus in the proximal left ureter with resultant mild hydronephrosis.  2. Additional bilateral punctate nephrolithiasis. 3. Hepatic steatosis. Electronically Signed   By: Obie Dredge M.D.   On: 01/09/2021 15:12    Procedures Procedures   Medications Ordered in ED Medications  ketorolac (TORADOL) injection 60 mg (60 mg Intramuscular Given 01/09/21 1550)  ondansetron (ZOFRAN) injection 4 mg (4 mg Intravenous Given 01/09/21 1550)   ED Course  I have reviewed the triage vital signs and the nursing notes.  Pertinent labs & imaging results that were available during my care of the patient were reviewed by me and considered in my medical decision making (see chart for details).  Clinical Course as of 01/09/21 1812  Tue Jan 09, 2021  1540 Patient currently has 10/10 pain.  Denies any nausea at this time.  Will treat pain with IM Toradol.  Will closely monitor and reassess. [LJ]  1719 Bacteria, UA(!): MANY [LJ]    Clinical Course User Index [LJ] Placido Sou, PA-C   MDM Rules/Calculators/A&P                          Pt is a 50 y.o. male who presents to the emergency department due to what appears to be a kidney stone.  Labs: CBC with a white count of 11.1 and neutrophils of 9. CMP with a glucose of 115, total protein of 8.5, ALT of  45. UA with an elevated specific gravity, large hemoglobin, 30 protein, many bacteria. Lipase of 30. Urine culture sent.  Imaging: CT renal stone study shows a 4 mm calculus in the proximal left ureter with resultant mild hydronephrosis.  Additional bilateral punctate nephrolithiasis.  Hepatic steatosis.  I, Placido Sou, PA-C, personally reviewed and evaluated these images and lab results as part of my medical decision-making.  Patient given IM Toradol for management of his pain as well as Zofran for nausea.  Notes significant improvement in his symptoms.  States his pain is almost completely resolved.  Lab work is generally reassuring.  Mild leukocytosis and neutrophilia.  Likely reactive.  UA does showed many bacteria but no nitrites or leukocytes.  Urine culture sent.  Patient denies any urinary complaints.  Doubt concomitant UTI.  Feel the patient is stable for discharge at this time and he is agreeable.  Recommended continued use of ibuprofen for his pain.  We will give him a short prescription for Vicodin for breakthrough pain.  Discussed safety regarding this medication.  He was prescribed Zofran as well as Flomax during his visit earlier today and urged him to continue taking these medications.  We discussed dosing.  He verbalized understanding of the above plan.  He was given a referral to urology for follow-up.  Given strict return precautions.  His questions were answered and he was amicable at the time of discharge.  Note: Portions of this report may have been transcribed using voice recognition software. Every effort was made to ensure accuracy; however, inadvertent computerized transcription errors may be present.   Final Clinical Impression(s) / ED Diagnoses Final diagnoses:  Kidney stone    Rx / DC Orders ED Discharge Orders          Ordered    HYDROcodone-acetaminophen (NORCO/VICODIN) 5-325 MG tablet  Every 6 hours PRN        01/09/21 1810             Placido Sou, PA-C 01/09/21 1814    Koleen Distance, MD 01/09/21 2307

## 2021-01-09 NOTE — Discharge Instructions (Addendum)
I have prescribed you a strong narcotic called Vicodin. Please only take this as prescribed. This medication also has tylenol in it, so please be sure you are not taking more than 3000 mg of tylenol per day. Do not drive or operate heavy machinery after taking this medication. Do not mix it with alcohol.   I would recommend taking ibuprofen for management of her pain during the day.  I recommend 600 mg 3 times a day.  Only take the Vicodin if you are experiencing breakthrough pain that you cannot control while taking ibuprofen.  You can continue to take Zofran up to 3 times a day for breakthrough nausea and vomiting.  Only take this if you are experiencing nausea and vomiting that you cannot control.  You can also continue to take your Flomax.  I recommend taking this once per day in the morning.  This will cause increased urinary frequency and hopefully help you pass the kidney stone faster.  Please continue to monitor symptoms closely.  If you develop fevers, burning with urination, worsening pain, please come back to the emergency department for reevaluation.    It was a pleasure to meet you.

## 2021-01-09 NOTE — ED Notes (Signed)
Pt aware urine specimen ordered. Specimen collection device provided to patient.

## 2021-01-09 NOTE — Telephone Encounter (Signed)
3 advil and med Reason for Disposition  [1] SEVERE pain (e.g., excruciating, scale 8-10) AND [2] present > 1 hour  Answer Assessment - Initial Assessment Questions 1. LOCATION: "Where does it hurt?" (e.g., left, right)     left 2. ONSET: "When did the pain start?"     Today 0600 3. SEVERITY: "How bad is the pain?" (e.g., Scale 1-10; mild, moderate, or severe)   - MILD (1-3): doesn't interfere with normal activities    - MODERATE (4-7): interferes with normal activities or awakens from sleep    - SEVERE (8-10): excruciating pain and patient unable to do normal activities (stays in bed)       severe 4. PATTERN: "Does the pain come and go, or is it constant?"      constant 5. CAUSE: "What do you think is causing the pain?"     Kidney stone 6. OTHER SYMPTOMS:  "Do you have any other symptoms?" (e.g., fever, abdominal pain, vomiting, leg weakness, burning with urination, blood in urine)     Vomiting last 1 hour ago- undigested food  Protocols used: Flank Pain-A-AH

## 2021-01-09 NOTE — ED Notes (Signed)
Pt ambulatory, normal gait to triage

## 2021-01-10 LAB — URINE CULTURE: Culture: NO GROWTH

## 2021-01-15 ENCOUNTER — Other Ambulatory Visit (HOSPITAL_BASED_OUTPATIENT_CLINIC_OR_DEPARTMENT_OTHER): Payer: Self-pay

## 2022-10-24 DIAGNOSIS — J3489 Other specified disorders of nose and nasal sinuses: Secondary | ICD-10-CM | POA: Diagnosis not present

## 2022-10-24 DIAGNOSIS — J342 Deviated nasal septum: Secondary | ICD-10-CM | POA: Diagnosis not present

## 2022-12-10 IMAGING — CT CT RENAL STONE PROTOCOL
2 of 4 series · 16 of 46 positions shown, 18 images · non-contrast
Comparison: None.

CLINICAL DATA: Left flank pain.

EXAM:
CT ABDOMEN AND PELVIS WITHOUT CONTRAST
TECHNIQUE: Multidetector CT imaging of the abdomen and pelvis was performed
following the standard protocol without IV contrast.

[Series 2: axial st · axial · 0.97mm/px · z∈[-581,-76]mm · 13 of 111 slices shown, 15 images]
[im 5/111  soft-tissue]
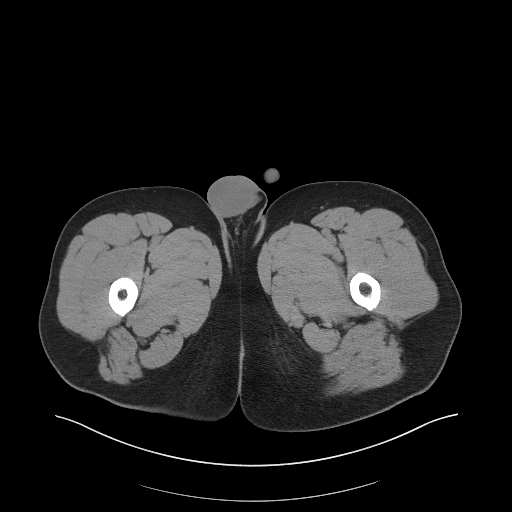
[im 5/111  bone]
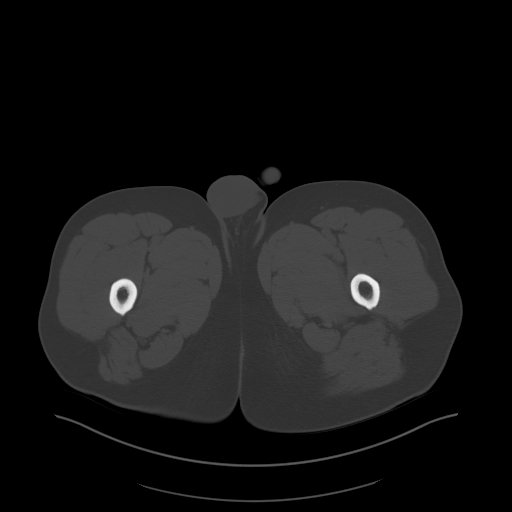
[im 15/111  soft-tissue]
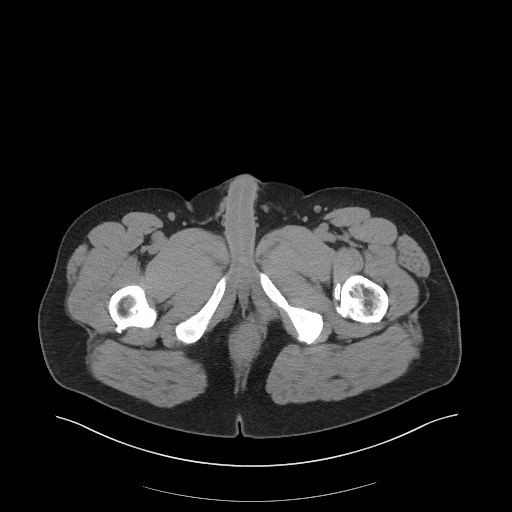
[im 24/111  soft-tissue]
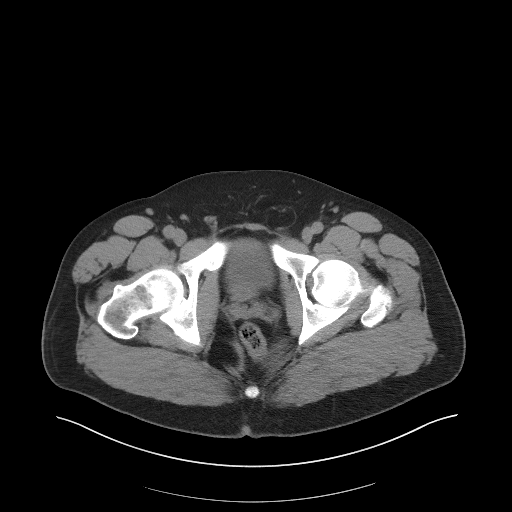
[im 29/111  soft-tissue]
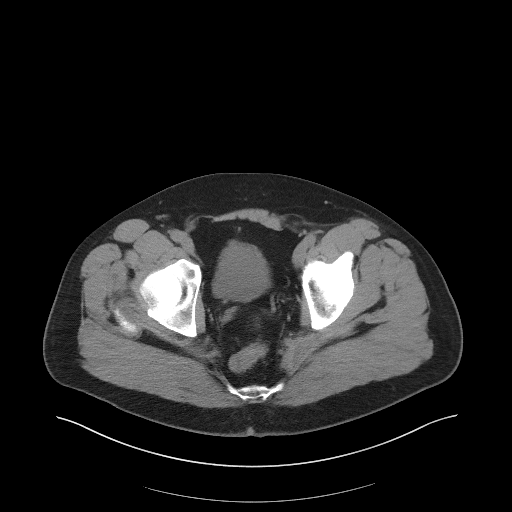
[im 39/111  soft-tissue]
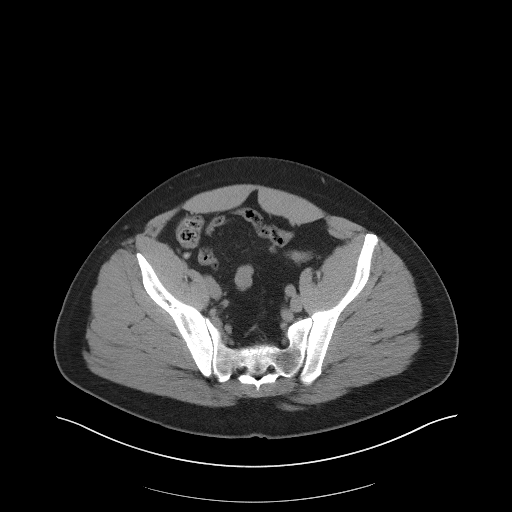
[im 48/111  soft-tissue]
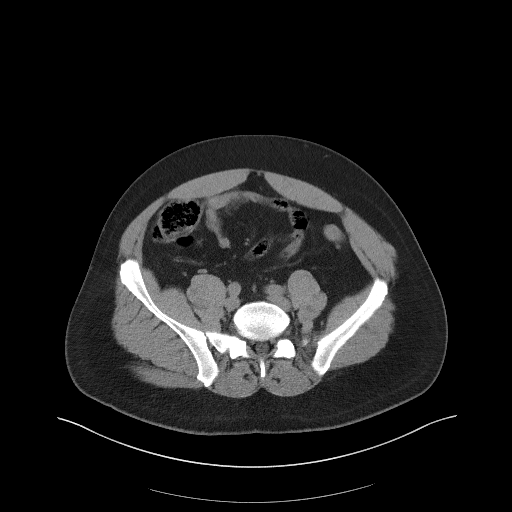
[im 58/111  soft-tissue]
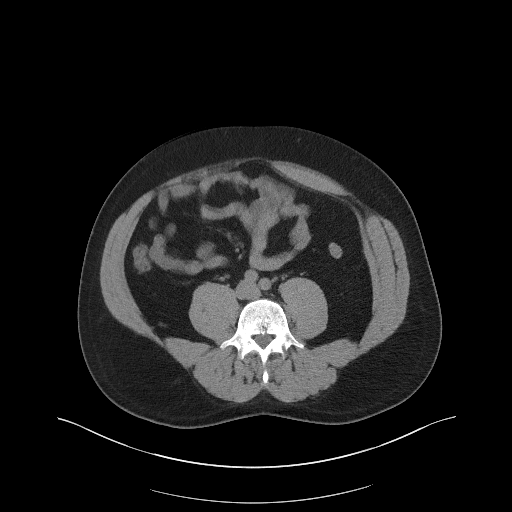
[im 63/111  soft-tissue]
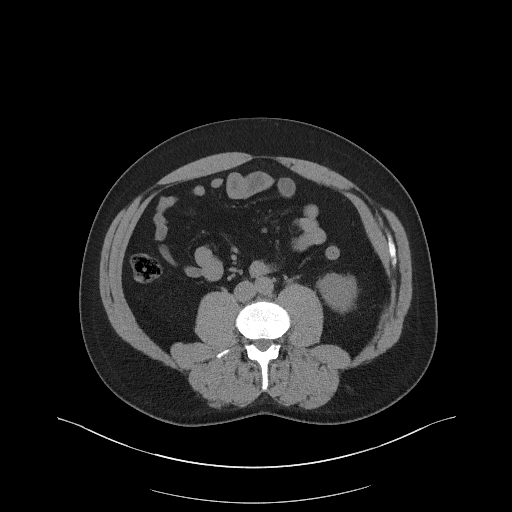
[im 72/111  soft-tissue]
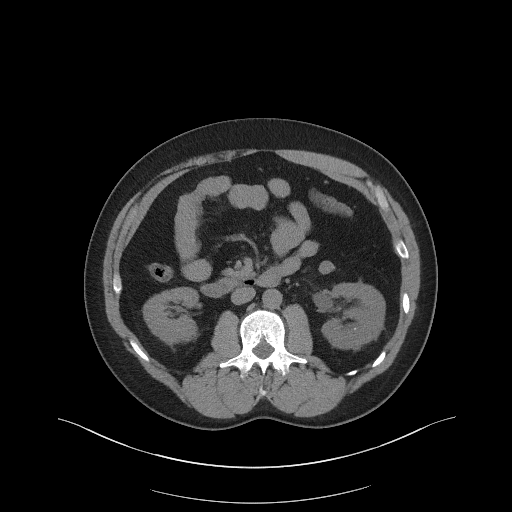
[im 72/111  bone]
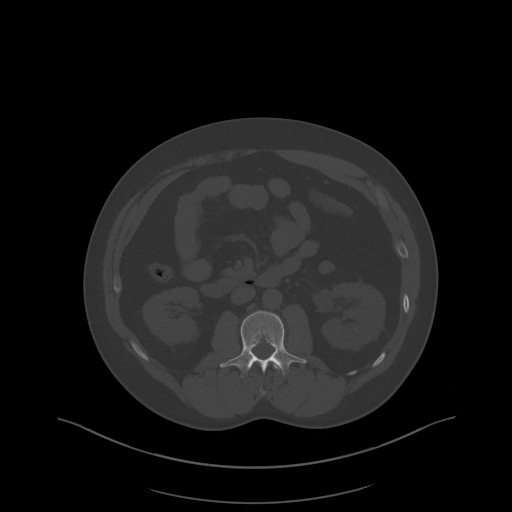
[im 82/111  soft-tissue]
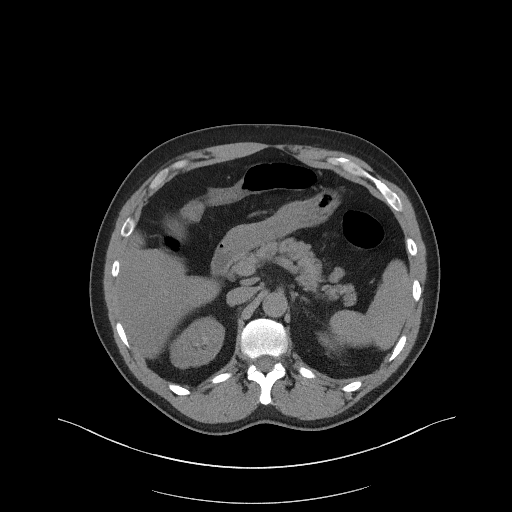
[im 87/111  soft-tissue]
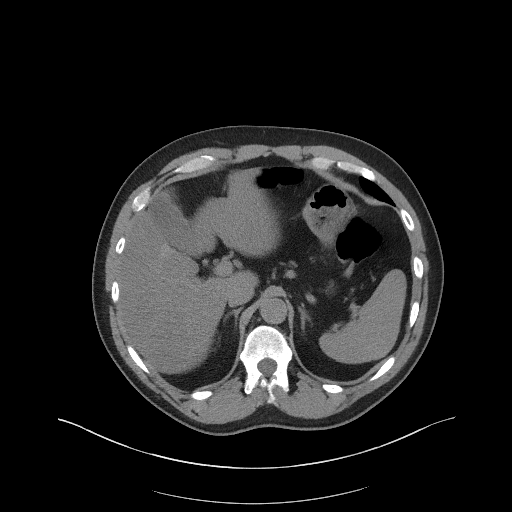
[im 96/111  soft-tissue]
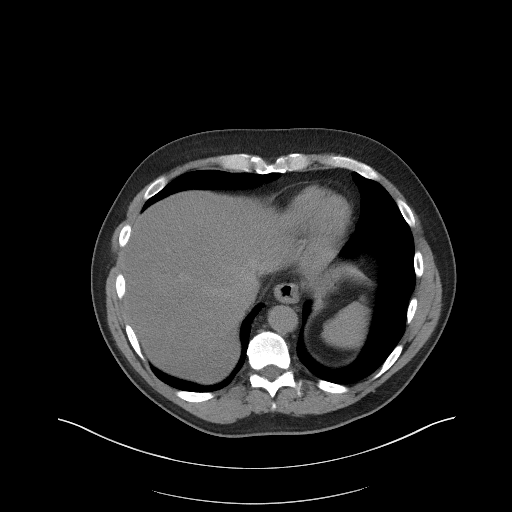
[im 106/111  soft-tissue]
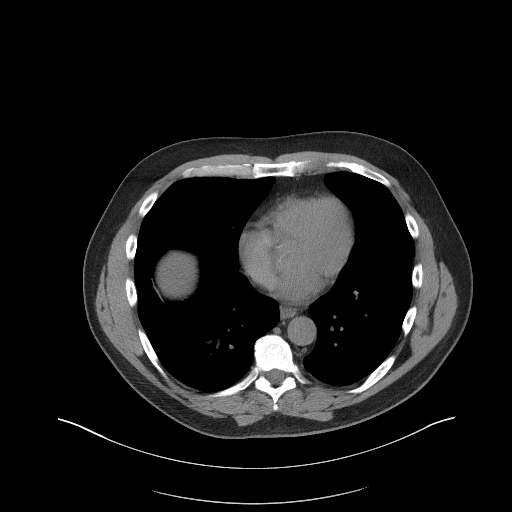

[Series 5: coronal st · coronal · 0.78mm/px · 3 of 102 slices shown]
[im 34/102  soft-tissue]
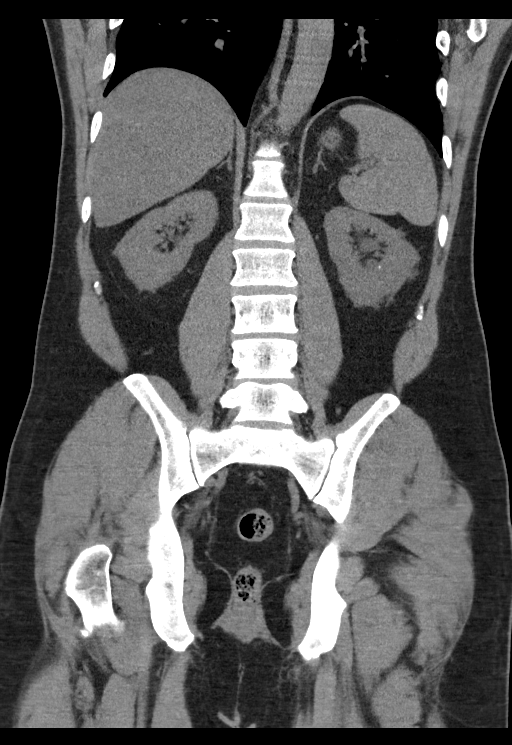
[im 45/102  soft-tissue]
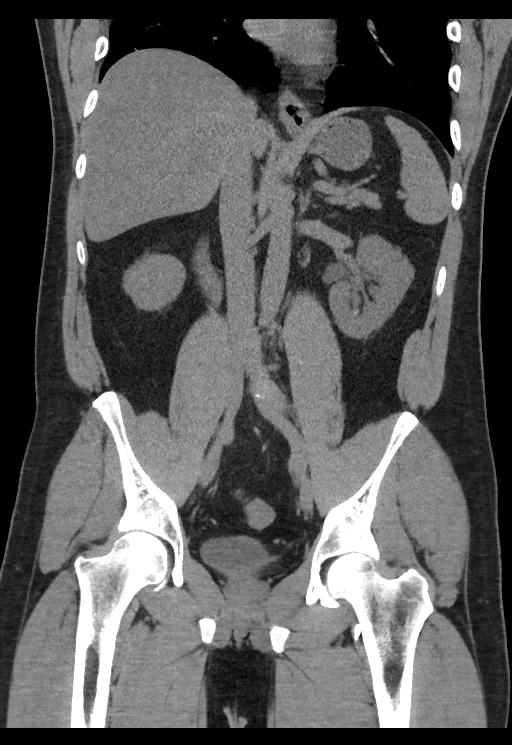
[im 57/102  soft-tissue]
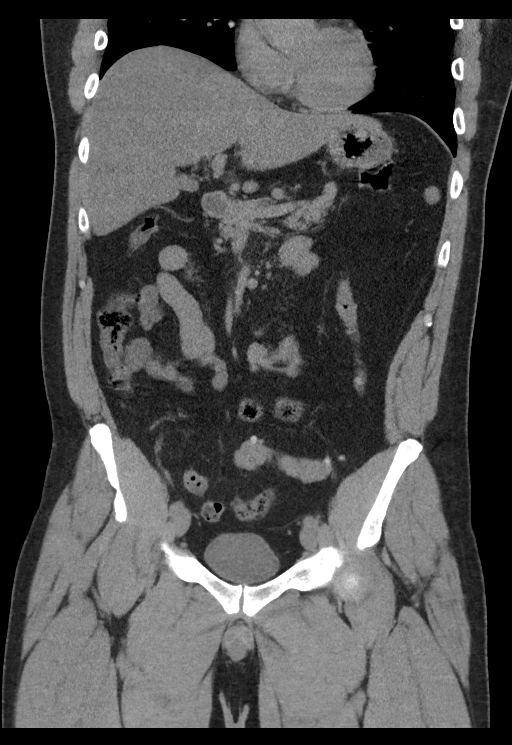

[16 of 46 positions shown; findings below may reference images not displayed]

FINDINGS: Lower chest: No acute abnormality.

Hepatobiliary: Diffusely decreased liver density with areas of
sparing along the gallbladder fossa. No focal liver abnormality. The
gallbladder is unremarkable. No biliary dilatation.

Pancreas: Unremarkable. No pancreatic ductal dilatation or
surrounding inflammatory changes.

Spleen: Normal in size without focal abnormality.

Adrenals/Urinary Tract: The adrenal glands are unremarkable.
Bilateral punctate renal calculi. 4 mm calculus in the proximal left
ureter with resultant mild hydronephrosis. The bladder is
unremarkable for the degree of distension.

Stomach/Bowel: Small hiatal hernia. The stomach is otherwise within
normal limits. No bowel wall thickening, distention, or surrounding
inflammatory changes. Moderate sigmoid colonic diverticulosis.
Normal appendix.

Vascular/Lymphatic: No significant vascular findings are present. No
enlarged abdominal or pelvic lymph nodes.

Reproductive: Prostate is unremarkable.

Other: Small fat containing left inguinal hernia. No free fluid or
pneumoperitoneum.

Musculoskeletal: No acute or significant osseous findings.
IMPRESSION: 1. 4 mm calculus in the proximal left ureter with resultant mild
hydronephrosis.
2. Additional bilateral punctate nephrolithiasis.
3. Hepatic steatosis.

## 2023-02-26 DIAGNOSIS — R0683 Snoring: Secondary | ICD-10-CM | POA: Diagnosis not present

## 2023-02-26 DIAGNOSIS — Z0000 Encounter for general adult medical examination without abnormal findings: Secondary | ICD-10-CM | POA: Diagnosis not present

## 2023-02-26 DIAGNOSIS — B356 Tinea cruris: Secondary | ICD-10-CM | POA: Diagnosis not present

## 2023-02-26 DIAGNOSIS — E559 Vitamin D deficiency, unspecified: Secondary | ICD-10-CM | POA: Diagnosis not present

## 2023-02-26 DIAGNOSIS — Z1211 Encounter for screening for malignant neoplasm of colon: Secondary | ICD-10-CM | POA: Diagnosis not present

## 2023-02-26 DIAGNOSIS — M25511 Pain in right shoulder: Secondary | ICD-10-CM | POA: Diagnosis not present

## 2023-02-26 DIAGNOSIS — Z125 Encounter for screening for malignant neoplasm of prostate: Secondary | ICD-10-CM | POA: Diagnosis not present

## 2023-02-26 DIAGNOSIS — Z13220 Encounter for screening for lipoid disorders: Secondary | ICD-10-CM | POA: Diagnosis not present

## 2023-04-30 DIAGNOSIS — Z1211 Encounter for screening for malignant neoplasm of colon: Secondary | ICD-10-CM | POA: Diagnosis not present
